# Patient Record
Sex: Female | Born: 1995 | Hispanic: No | Marital: Single | Smoking: Never smoker
Health system: Southern US, Community
[De-identification: ages and names within clinical notes are randomized; demographics above are authoritative.]

## PROBLEM LIST (undated history)

## (undated) ENCOUNTER — Inpatient Hospital Stay (HOSPITAL_COMMUNITY): Payer: Self-pay

## (undated) DIAGNOSIS — Z789 Other specified health status: Secondary | ICD-10-CM

## (undated) HISTORY — PX: NO PAST SURGERIES: SHX2092

---
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
## 2016-07-24 NOTE — L&D Delivery Note (Signed)
Operative Delivery Note At 7:33 PM a viable female was delivered via Vaginal, Vacuum Investment banker, operational(Extractor).  Presentation: OA; Position: Occiput,, Anterior; Station: +2.  FHR was in the 60s and not responding to change in position or any other manipulation. I decided the fstest and safest route of delivery was a vacuum extraction.  Placed the mighty vac as stated above With 2 contractions delivered the baby without difficulty  Verbal consent: obtained from patient and family  Risks and benefits discussed in detail.  Risks include, but are not limited to the risks of anesthesia, bleeding, infection, damage to maternal tissues, fetal cephalhematoma.  There is also the risk of inability to effect vaginal delivery of the head, or shoulder dystocia that cannot be resolved by established maneuvers, leading to the need for emergency cesarean section.  APGAR: pending ; weight  pending Placenta status: , intact normal Cord: 3 vessel  with the following complications: . None  Cord pH: 7.18  Anesthesia:  none Instruments: mighty vac Episiotomy: None Lacerations: 2nd degree;Sulcus;Perineal;Vaginal Suture Repair: 3.0 monocryl Est. Blood Loss (mL): 250  Mom to postpartum.  Baby to Couplet care / Skin to Skin.  Lazaro ArmsLuther H Oriel Rumbold 07/18/2017, 7:57 PM

## 2017-03-29 ENCOUNTER — Encounter: Payer: Self-pay | Admitting: Obstetrics and Gynecology

## 2017-03-29 ENCOUNTER — Ambulatory Visit (INDEPENDENT_AMBULATORY_CARE_PROVIDER_SITE_OTHER): Payer: Medicaid Other | Admitting: Obstetrics and Gynecology

## 2017-03-29 ENCOUNTER — Other Ambulatory Visit (HOSPITAL_COMMUNITY)
Admission: RE | Admit: 2017-03-29 | Discharge: 2017-03-29 | Disposition: A | Discharge status: Home / Self Care | Payer: Medicaid Other | Source: Ambulatory Visit | Attending: Obstetrics and Gynecology | Admitting: Obstetrics and Gynecology

## 2017-03-29 VITALS — BP 128/78 | HR 80 | Ht 62.0 in | Wt 137.1 lb

## 2017-03-29 DIAGNOSIS — Z23 Encounter for immunization: Secondary | ICD-10-CM

## 2017-03-29 DIAGNOSIS — O0932 Supervision of pregnancy with insufficient antenatal care, second trimester: Secondary | ICD-10-CM | POA: Diagnosis not present

## 2017-03-29 DIAGNOSIS — O0930 Supervision of pregnancy with insufficient antenatal care, unspecified trimester: Secondary | ICD-10-CM | POA: Insufficient documentation

## 2017-03-29 DIAGNOSIS — Z3480 Encounter for supervision of other normal pregnancy, unspecified trimester: Secondary | ICD-10-CM | POA: Insufficient documentation

## 2017-03-29 NOTE — Progress Notes (Signed)
Subjective:  Tracy Wang is a 21 y.o. G1P0 at 128w5d being seen today for her first OB appt. EDD by probable certain LMP. No chronic medical problems or medications.  She is currently monitored for the following issues for this low-risk pregnancy and has Supervision of other normal pregnancy, antepartum and Late prenatal care affecting pregnancy, antepartum, second trimester on her problem list.  Patient reports no complaints.  Contractions: Not present. Vag. Bleeding: None.  Movement: Present. Denies leaking of fluid.   The following portions of the patient's history were reviewed and updated as appropriate: allergies, current medications, past family history, past medical history, past social history, past surgical history and problem list. Problem list updated.  Objective:   Vitals:   03/29/17 1338 03/29/17 1339  BP: 128/78   Pulse: (!) 10   Weight: 137 lb 1.6 oz (62.2 kg)   Height:  5\' 2"  (1.575 m)    Fetal Status: Fetal Heart Rate (bpm): 156   Movement: Present     General:  Alert, oriented and cooperative. Patient is in no acute distress.  Skin: Skin is warm and dry. No rash noted.   Cardiovascular: Normal heart rate noted  Respiratory: Normal respiratory effort, no problems with respiration noted  Abdomen: Soft, gravid, appropriate for gestational age. Pain/Pressure: Absent     Pelvic:  Cervical exam deferred        Extremities: Normal range of motion.  Edema: None  Mental Status: Normal mood and affect. Normal behavior. Normal judgment and thought content.   Urinalysis:      Assessment and Plan:  Pregnancy: G1P0 at 1328w5d  1. Late prenatal care affecting pregnancy, antepartum, second trimester Prenatal care and labs reviewed today Flu vaccine - Obstetric Panel, Including HIV - Hemoglobinopathy evaluation - Cystic Fibrosis Mutation 97 - Culture, OB Urine - Varicella zoster antibody, IgG - Flu Vaccine QUAD 36+ mos IM (Fluarix, Quad PF) - AFP TETRA - US MFM OB COMP + 14  WK; Future - Urine cytology ancillary only  2. Supervision of other normal pregnancy, antepartum As above  Preterm labor symptoms and general obstetric precautions including but not limited to vaginal bleeding, contractions, leaking of fluid and fetal movement were reviewed in detail with the patient. Please refer to After Visit Summary for other counseling recommendations.  Return in about 4 weeks (around 04/26/2017) for OB visit.   Hermina StaggersErvin, Lorie Melichar L, MD

## 2017-03-29 NOTE — Patient Instructions (Signed)

## 2017-03-30 LAB — URINE CYTOLOGY ANCILLARY ONLY
Chlamydia: NEGATIVE
Neisseria Gonorrhea: NEGATIVE
TRICH (WINDOWPATH): NEGATIVE

## 2017-03-31 LAB — URINE CULTURE, OB REFLEX: ORGANISM ID, BACTERIA: NO GROWTH

## 2017-03-31 LAB — CULTURE, OB URINE

## 2017-04-02 LAB — OBSTETRIC PANEL, INCLUDING HIV
Antibody Screen: NEGATIVE
BASOS: 0 %
Basophils Absolute: 0 10*3/uL (ref 0.0–0.2)
EOS (ABSOLUTE): 0.1 10*3/uL (ref 0.0–0.4)
Eos: 1 %
HEP B S AG: NEGATIVE
HIV SCREEN 4TH GENERATION: NONREACTIVE
Hematocrit: 36.6 % (ref 34.0–46.6)
Hemoglobin: 11.5 g/dL (ref 11.1–15.9)
Immature Grans (Abs): 0.1 10*3/uL (ref 0.0–0.1)
Immature Granulocytes: 1 %
LYMPHS ABS: 1.7 10*3/uL (ref 0.7–3.1)
Lymphs: 16 %
MCH: 25.7 pg — ABNORMAL LOW (ref 26.6–33.0)
MCHC: 31.4 g/dL — ABNORMAL LOW (ref 31.5–35.7)
MCV: 82 fL (ref 79–97)
MONOS ABS: 0.7 10*3/uL (ref 0.1–0.9)
Monocytes: 7 %
NEUTROS ABS: 8.5 10*3/uL — AB (ref 1.4–7.0)
Neutrophils: 75 %
PLATELETS: 370 10*3/uL (ref 150–379)
RBC: 4.48 x10E6/uL (ref 3.77–5.28)
RDW: 14.7 % (ref 12.3–15.4)
RPR Ser Ql: NONREACTIVE
RUBELLA: 2.99 {index} (ref >0.99)
Rh Factor: POSITIVE
WBC: 11.1 10*3/uL — AB (ref 3.4–10.8)

## 2017-04-02 LAB — HEMOGLOBINOPATHY EVALUATION
HEMOGLOBIN A2 QUANTITATION: 2.1 % (ref 1.8–3.2)
HEMOGLOBIN F QUANTITATION: 0 % (ref 0.0–2.0)
HGB C: 0 %
HGB S: 0 %
HGB VARIANT: 0 %
Hgb A: 97.9 % (ref 96.4–98.8)

## 2017-04-02 LAB — VARICELLA ZOSTER ANTIBODY, IGG: Varicella zoster IgG: <135 index — ABNORMAL LOW (ref >165)

## 2017-04-03 LAB — AFP TETRA
DIA Mom Value: 0.53
DIA VALUE (EIA): 116.71 pg/mL
DSR (BY AGE) 1 IN: 1161
DSR (Second Trimester) 1 IN: 10000
Gestational Age: 20.5 WEEKS
MATERNAL AGE AT EDD: 20.2 a
MSAFP Mom: 1.51
MSAFP: 95.9 ng/mL
MSHCG Mom: 0.93
MSHCG: 22997 m[IU]/mL
OSB RISK: 2661
T18 (By Age): 1:4522 {titer}
TEST RESULTS AFP: NEGATIVE
Weight: 137 [lb_av]
uE3 Mom: 1.25
uE3 Value: 2.48 ng/mL

## 2017-04-04 LAB — CYSTIC FIBROSIS MUTATION 97: GENE DIS ANAL CARRIER INTERP BLD/T-IMP: NOT DETECTED

## 2017-04-04 LAB — URINE CYTOLOGY ANCILLARY ONLY
Bacterial vaginitis: NEGATIVE
CANDIDA VAGINITIS: NEGATIVE

## 2017-04-09 ENCOUNTER — Encounter: Payer: Self-pay | Admitting: Obstetrics and Gynecology

## 2017-04-09 ENCOUNTER — Ambulatory Visit (HOSPITAL_COMMUNITY)
Admission: RE | Admit: 2017-04-09 | Discharge: 2017-04-09 | Disposition: A | Discharge status: Home / Self Care | Payer: Medicaid Other | Source: Ambulatory Visit | Attending: Obstetrics and Gynecology | Admitting: Obstetrics and Gynecology

## 2017-04-09 DIAGNOSIS — O0932 Supervision of pregnancy with insufficient antenatal care, second trimester: Secondary | ICD-10-CM | POA: Diagnosis not present

## 2017-04-09 DIAGNOSIS — Z3A22 22 weeks gestation of pregnancy: Secondary | ICD-10-CM | POA: Insufficient documentation

## 2017-04-09 DIAGNOSIS — Z363 Encounter for antenatal screening for malformations: Secondary | ICD-10-CM | POA: Insufficient documentation

## 2017-04-09 DIAGNOSIS — Z283 Underimmunization status: Secondary | ICD-10-CM

## 2017-04-09 DIAGNOSIS — O09899 Supervision of other high risk pregnancies, unspecified trimester: Secondary | ICD-10-CM | POA: Insufficient documentation

## 2017-04-17 ENCOUNTER — Encounter (HOSPITAL_COMMUNITY): Payer: Self-pay | Admitting: *Deleted

## 2017-04-17 ENCOUNTER — Inpatient Hospital Stay (HOSPITAL_COMMUNITY)
Admission: AD | Admit: 2017-04-17 | Discharge: 2017-04-17 | Disposition: A | Discharge status: Home / Self Care | Payer: Medicaid Other | Source: Ambulatory Visit | Attending: Family Medicine | Admitting: Family Medicine

## 2017-04-17 DIAGNOSIS — B373 Candidiasis of vulva and vagina: Secondary | ICD-10-CM | POA: Diagnosis not present

## 2017-04-17 DIAGNOSIS — O98812 Other maternal infectious and parasitic diseases complicating pregnancy, second trimester: Secondary | ICD-10-CM | POA: Insufficient documentation

## 2017-04-17 DIAGNOSIS — R109 Unspecified abdominal pain: Secondary | ICD-10-CM | POA: Diagnosis not present

## 2017-04-17 DIAGNOSIS — N949 Unspecified condition associated with female genital organs and menstrual cycle: Secondary | ICD-10-CM

## 2017-04-17 DIAGNOSIS — Z3A23 23 weeks gestation of pregnancy: Secondary | ICD-10-CM | POA: Diagnosis not present

## 2017-04-17 DIAGNOSIS — O9989 Other specified diseases and conditions complicating pregnancy, childbirth and the puerperium: Secondary | ICD-10-CM

## 2017-04-17 DIAGNOSIS — R102 Pelvic and perineal pain: Secondary | ICD-10-CM | POA: Insufficient documentation

## 2017-04-17 DIAGNOSIS — B3731 Acute candidiasis of vulva and vagina: Secondary | ICD-10-CM

## 2017-04-17 LAB — URINALYSIS, ROUTINE W REFLEX MICROSCOPIC
BILIRUBIN URINE: NEGATIVE
Glucose, UA: NEGATIVE mg/dL
Hgb urine dipstick: NEGATIVE
KETONES UR: NEGATIVE mg/dL
NITRITE: NEGATIVE
PH: 7 (ref 5.0–8.0)
Protein, ur: 30 mg/dL — AB
Specific Gravity, Urine: 1.018 (ref 1.005–1.030)

## 2017-04-17 LAB — WET PREP, GENITAL
Clue Cells Wet Prep HPF POC: NONE SEEN
Sperm: NONE SEEN
TRICH WET PREP: NONE SEEN

## 2017-04-17 MED ORDER — TERCONAZOLE 0.4 % VA CREA: 1.0000 | TOPICAL_CREAM | Freq: Every day | VAGINAL | 0 refills | Status: DC

## 2017-04-17 NOTE — Discharge Instructions (Signed)
Round Ligament Pain The round ligament is a cord of muscle and tissue that helps to support the uterus. It can become a source of pain during pregnancy if it becomes stretched or twisted as the baby grows. The pain usually begins in the second trimester of pregnancy, and it can come and go until the baby is delivered. It is not a serious problem, and it does not cause harm to the baby. Round ligament pain is usually a short, sharp, and pinching pain, but it can also be a dull, lingering, and aching pain. The pain is felt in the lower side of the abdomen or in the groin. It usually starts deep in the groin and moves up to the outside of the hip area. Pain can occur with:  A sudden change in position.  Rolling over in bed.  Coughing or sneezing.  Physical activity.  Follow these instructions at home: Watch your condition for any changes. Take these steps to help with your pain:  When the pain starts, relax. Then try: ? Sitting down. ? Flexing your knees up to your abdomen. ? Lying on your side with one pillow under your abdomen and another pillow between your legs. ? Sitting in a warm bath for 15-20 minutes or until the pain goes away.  Take over-the-counter and prescription medicines only as told by your health care provider.  Move slowly when you sit and stand.  Avoid long walks if they cause pain.  Stop or lessen your physical activities if they cause pain.  Contact a health care provider if:  Your pain does not go away with treatment.  You feel pain in your back that you did not have before.  Your medicine is not helping. Get help right away if:  You develop a fever or chills.  You develop uterine contractions.  You develop vaginal bleeding.  You develop nausea or vomiting.  You develop diarrhea.  You have pain when you urinate. This information is not intended to replace advice given to you by your health care provider. Make sure you discuss any questions you have  with your health care provider. Document Released: 04/18/2008 Document Revised: 12/16/2015 Document Reviewed: 09/16/2014 Elsevier Interactive Patient Education  2018 [employer] Vaginal Yeast infection, Adult Vaginal yeast infection is a condition that causes soreness, swelling, and redness (inflammation) of the vagina. It also causes vaginal discharge. This is a common condition. Some women get this infection frequently. What are the causes? This condition is caused by a change in the normal balance of the yeast (candida) and bacteria that live in the vagina. This change causes an overgrowth of yeast, which causes the inflammation. What increases the risk? This condition is more likely to develop in:  Women who take antibiotic medicines.  Women who have diabetes.  Women who take birth control pills.  Women who are pregnant.  Women who douche often.  Women who have a weak defense (immune) system.  Women who have been taking steroid medicines for a long time.  Women who frequently wear tight clothing.  What are the signs or symptoms? Symptoms of this condition include:  White, thick vaginal discharge.  Swelling, itching, redness, and irritation of the vagina. The lips of the vagina (vulva) may be affected as well.  Pain or a burning feeling while urinating.  Pain during sex.  How is this diagnosed? This condition is diagnosed with a medical history and physical exam. This will include a pelvic exam. Your health care provider will  examine a sample of your vaginal discharge under a microscope. Your health care provider may send this sample for testing to confirm the diagnosis. °How is this treated? °This condition is treated with medicine. Medicines may be over-the-counter or prescription. You may be told to use one or more of the following: °· Medicine that is taken orally. °· Medicine that is applied as a cream. °· Medicine that is inserted directly into the vagina  (suppository). ° °Follow these instructions at home: °· Take or apply over-the-counter and prescription medicines only as told by your health care provider. °· Do not have sex until your health care provider has approved. Tell your sex partner that you have a yeast infection. That person should go to his or her health care provider if he or she develops symptoms. °· Do not wear tight clothes, such as pantyhose or tight pants. °· Avoid using tampons until your health care provider approves. °· Eat more yogurt. This may help to keep your yeast infection from returning. °· Try taking a sitz bath to help with discomfort. This is a warm water bath that is taken while you are sitting down. The water should only come up to your hips and should cover your buttocks. Do this 3-4 times per day or as told by your health care provider. °· Do not douche. °· Wear breathable, cotton underwear. °· If you have diabetes, keep your blood sugar levels under control. °Contact a health care provider if: °· You have a fever. °· Your symptoms go away and then return. °· Your symptoms do not get better with treatment. °· Your symptoms get worse. °· You have new symptoms. °· You develop blisters in or around your vagina. °· You have blood coming from your vagina and it is not your menstrual period. °· You develop pain in your abdomen. °This information is not intended to replace advice given to you by your health care provider. Make sure you discuss any questions you have with your health care provider. °Document Released: 04/19/2005 Document Revised: 12/22/2015 Document Reviewed: 01/11/2015 °Elsevier Interactive Patient Education © 2018 Elsevier Inc. ° °

## 2017-04-17 NOTE — MAU Provider Note (Signed)
History     CSN: [id number]  Arrival date and time: 04/17/17 1631   First Provider Initiated Contact with Patient 04/17/17 1749      Chief Complaint  Patient presents with  . Abdominal Pain  . Dysuria   G1 .3 wks here with LAP. Pain started about 3 hrs ago. She describes as constant, cramping in the bilateral lower abdomen. Has not taken anything for the pain. No VB or vaginal discharge. Good FM. No urinary sx. Had BM this am. No diarrhea. Eating and drinking well but admits to poor water intake today.   OB History    Gravida Para Term Preterm AB Living   1             SAB TAB Ectopic Multiple Live Births                  History reviewed. No pertinent past medical history.  History reviewed. No pertinent surgical history.  History reviewed. No pertinent family history.  Social History  Substance Use Topics  . Smoking status: Never Smoker  . Smokeless tobacco: Never Used  . Alcohol use No    Allergies: No Known Allergies  Prescriptions Prior to Admission  Medication Sig Dispense Refill Last Dose  . Prenatal Vit-Fe Fumarate-FA (MULTIVITAMIN-PRENATAL) 27-0.8 MG TABS tablet Take 1 tablet by mouth daily at 12 noon.   Taking    Review of Systems  Constitutional: Negative for fever.  Gastrointestinal: Positive for abdominal pain. Negative for constipation, diarrhea, nausea and vomiting.  Genitourinary: Negative for dysuria, hematuria, urgency, vaginal bleeding and vaginal discharge.   Physical Exam   Blood pressure 109/65, pulse 78, temperature 97.7 F (36.5 C), temperature source Oral, resp. rate 16, weight 138 lb 1.3 oz (62.6 kg), last menstrual period 11/04/2016, SpO2 100 %.  Physical Exam  Nursing note and vitals reviewed. Constitutional: She is oriented to person, place, and time. She appears well-developed and well-nourished. No distress.  HENT:  Head: Normocephalic and atraumatic.  Neck: Normal range of motion.  Cardiovascular: Normal rate.    Respiratory: Effort normal.  GI: Soft. She exhibits no distension and no mass. There is tenderness in the right lower quadrant and left lower quadrant. There is no rebound and no guarding.  Genitourinary:  Genitourinary Comments: Cervix closed/thick Thick white curdy discharge at introitus  Musculoskeletal: Normal range of motion.  Neurological: She is alert and oriented to person, place, and time.  Skin: Skin is warm and dry.  Psychiatric: She has a normal mood and affect.  EFM: 150 bpm, mod variability, + accels, no decels Toco: none  Results for orders placed or performed during the hospital encounter of 04/17/17 (from the past 24 hour(s))  Urinalysis, Routine w reflex microscopic     Status: Abnormal   Collection Time: 04/17/17  4:56 PM  Result Value Ref Range   Color, Urine YELLOW YELLOW   APPearance CLOUDY (A) CLEAR   Specific Gravity, Urine 1.018 1.005 - 1.030   pH 7.0 5.0 - 8.0   Glucose, UA NEGATIVE NEGATIVE mg/dL   Hgb urine dipstick NEGATIVE NEGATIVE   Bilirubin Urine NEGATIVE NEGATIVE   Ketones, ur NEGATIVE NEGATIVE mg/dL   Protein, ur 30 (A) NEGATIVE mg/dL   Nitrite NEGATIVE NEGATIVE   Leukocytes, UA LARGE (A) NEGATIVE   RBC / HPF 0-5 0 - 5 RBC/hpf   WBC, UA TOO NUMEROUS TO COUNT 0 - 5 WBC/hpf   Bacteria, UA RARE (A) NONE SEEN   Squamous Epithelial / LPF 6-30 (  A) NONE SEEN   Mucus PRESENT    Amorphous Crystal PRESENT   Wet prep, genital     Status: Abnormal   Collection Time: 04/17/17  5:57 PM  Result Value Ref Range   Yeast Wet Prep HPF POC PRESENT (A) NONE SEEN   Trich, Wet Prep NONE SEEN NONE SEEN   Clue Cells Wet Prep HPF POC NONE SEEN NONE SEEN   WBC, Wet Prep HPF POC MANY (A) NONE SEEN   Sperm NONE SEEN    MAU Course  Procedures  MDM Labs ordered and reviewed. No evidence of UTI or PTL. Will treat yeast. Pain likely RL, discussed comfort measures. Stable for discharge home.  Assessment and Plan   1. [redacted] weeks gestation of pregnancy   2. Round  ligament pain   3. Vaginal yeast infection    Discharge home Follow up in OB office as scheduled Rx Terazol PTL precautions  Allergies as of 04/17/2017   No Known Allergies     Medication List    TAKE these medications   multivitamin-prenatal 27-0.8 MG Tabs tablet Take 1 tablet by mouth daily at 12 noon.   terconazole 0.4 % vaginal cream Commonly known as:  TERAZOL 7 Place 1 applicator vaginally at bedtime.            Discharge Care Instructions        Start     Ordered   04/17/17 0000  Discharge patient    Question Answer Comment  Discharge disposition 01-Home or Self Care   Discharge patient date 04/17/2017      04/17/17 1841   04/17/17 0000  terconazole (TERAZOL 7) 0.4 % vaginal cream  Daily at bedtime    Question:  Supervising Provider  Answer:  Levie Heritage   04/17/17 1841     Donette Larry, CNM 04/17/2017, 6:44 PM

## 2017-04-17 NOTE — MAU Note (Signed)
Around 3pm started having intermittent abdominal cramping--rating pain 8/10 Has not tried any medication for pain  dysuria

## 2017-04-18 LAB — GC/CHLAMYDIA PROBE AMP (~~LOC~~) NOT AT ARMC
Chlamydia: NEGATIVE
NEISSERIA GONORRHEA: NEGATIVE

## 2017-04-26 ENCOUNTER — Ambulatory Visit (INDEPENDENT_AMBULATORY_CARE_PROVIDER_SITE_OTHER): Payer: Medicaid Other | Admitting: Obstetrics and Gynecology

## 2017-04-26 VITALS — BP 118/70 | HR 84 | Wt 139.0 lb

## 2017-04-26 DIAGNOSIS — Z283 Underimmunization status: Secondary | ICD-10-CM

## 2017-04-26 DIAGNOSIS — O0932 Supervision of pregnancy with insufficient antenatal care, second trimester: Secondary | ICD-10-CM

## 2017-04-26 DIAGNOSIS — O09892 Supervision of other high risk pregnancies, second trimester: Secondary | ICD-10-CM

## 2017-04-26 DIAGNOSIS — O09899 Supervision of other high risk pregnancies, unspecified trimester: Secondary | ICD-10-CM

## 2017-04-26 DIAGNOSIS — Z3480 Encounter for supervision of other normal pregnancy, unspecified trimester: Secondary | ICD-10-CM

## 2017-04-26 NOTE — Patient Instructions (Signed)

## 2017-04-26 NOTE — Progress Notes (Signed)
Pt c/o insomina

## 2017-04-26 NOTE — Progress Notes (Signed)
   PRENATAL VISIT NOTE  Subjective:  Tracy Wang is a 21 y.o. G1P0 at [redacted]w[redacted]d being seen today for ongoing prenatal care.  She is currently monitored for the following issues for this low-risk pregnancy and has Supervision of other normal pregnancy, antepartum; Late prenatal care affecting pregnancy, antepartum, second trimester; and Susceptible to varicella (non-immune), currently pregnant on her problem list.  Patient reports no complaints.  Contractions: Not present. Vag. Bleeding: None.  Movement: Present. Denies leaking of fluid.   The following portions of the patient's history were reviewed and updated as appropriate: allergies, current medications, past family history, past medical history, past social history, past surgical history and problem list. Problem list updated.  Objective:   Vitals:   04/26/17 1332  BP: 118/70  Pulse: 84  Weight: 139 lb (63 kg)    Fetal Status: Fetal Heart Rate (bpm): 155 Fundal Height: 25 cm Movement: Present     General:  Alert, oriented and cooperative. Patient is in no acute distress.  Skin: Skin is warm and dry. No rash noted.   Cardiovascular: Normal heart rate noted  Respiratory: Normal respiratory effort, no problems with respiration noted  Abdomen: Soft, gravid, appropriate for gestational age.  Pain/Pressure: Absent     Pelvic: Cervical exam deferred        Extremities: Normal range of motion.  Edema: None  Mental Status:  Normal mood and affect. Normal behavior. Normal judgment and thought content.   Assessment and Plan:  Pregnancy: G1P0 at [redacted]w[redacted]d  1. Late prenatal care affecting pregnancy, antepartum, second trimester Patient reports she had [place] at outside clinic on 12/15/16 and was [redacted]w[redacted]d at that time, making her [facility] 07/29/17. This conflicts with MFM [place] and EDC of 08/11/17. Patient will discuss with MFM at repeat US in two weeks.   2. Supervision of other normal pregnancy, antepartum - cont txt for yeast dx at MAU - [place] MFM OB FOLLOW UP;  Future  3. Susceptible to varicella (non-immune), currently pregnant Needs vaccine PP   Preterm labor symptoms and general obstetric precautions including but not limited to vaginal bleeding, contractions, leaking of fluid and fetal movement were reviewed in detail with the patient. Please refer to After Visit Summary for other counseling recommendations.  Return in about 3 weeks (around 05/17/2017) for OB visit.   Conan Bowens, MD

## 2017-05-10 ENCOUNTER — Ambulatory Visit (HOSPITAL_COMMUNITY)
Admission: RE | Admit: 2017-05-10 | Discharge: 2017-05-10 | Disposition: A | Discharge status: Home / Self Care | Payer: Medicaid Other | Source: Ambulatory Visit | Attending: Obstetrics and Gynecology | Admitting: Obstetrics and Gynecology

## 2017-05-10 ENCOUNTER — Encounter (HOSPITAL_COMMUNITY): Payer: Self-pay

## 2017-05-10 ENCOUNTER — Other Ambulatory Visit: Payer: Self-pay | Admitting: Obstetrics and Gynecology

## 2017-05-10 DIAGNOSIS — Z3A26 26 weeks gestation of pregnancy: Secondary | ICD-10-CM | POA: Insufficient documentation

## 2017-05-10 DIAGNOSIS — O0932 Supervision of pregnancy with insufficient antenatal care, second trimester: Secondary | ICD-10-CM

## 2017-05-10 DIAGNOSIS — O26892 Other specified pregnancy related conditions, second trimester: Secondary | ICD-10-CM | POA: Insufficient documentation

## 2017-05-10 DIAGNOSIS — Z362 Encounter for other antenatal screening follow-up: Secondary | ICD-10-CM

## 2017-05-10 HISTORY — DX: Other specified health status: Z78.9

## 2017-05-17 ENCOUNTER — Ambulatory Visit (INDEPENDENT_AMBULATORY_CARE_PROVIDER_SITE_OTHER): Payer: Medicaid Other | Admitting: Obstetrics and Gynecology

## 2017-05-17 VITALS — BP 114/67 | HR 90 | Wt 145.0 lb

## 2017-05-17 DIAGNOSIS — Z23 Encounter for immunization: Secondary | ICD-10-CM | POA: Diagnosis not present

## 2017-05-17 DIAGNOSIS — Z283 Underimmunization status: Secondary | ICD-10-CM

## 2017-05-17 DIAGNOSIS — Z3480 Encounter for supervision of other normal pregnancy, unspecified trimester: Secondary | ICD-10-CM

## 2017-05-17 DIAGNOSIS — O09899 Supervision of other high risk pregnancies, unspecified trimester: Secondary | ICD-10-CM

## 2017-05-17 DIAGNOSIS — Z3482 Encounter for supervision of other normal pregnancy, second trimester: Secondary | ICD-10-CM

## 2017-05-17 NOTE — Progress Notes (Signed)
   PRENATAL VISIT NOTE  Subjective:  Tracy Wang is a 21 y.o. G1P0 at 4033w5d being seen today for ongoing prenatal care.  She is currently monitored for the following issues for this low-risk pregnancy and has Supervision of other normal pregnancy, antepartum; Late prenatal care affecting pregnancy, antepartum, second trimester; and Susceptible to varicella (non-immune), currently pregnant on her problem list.  Patient reports no complaints.  Contractions: Not present. Vag. Bleeding: None.  Movement: Present. Denies leaking of fluid.   The following portions of the patient's history were reviewed and updated as appropriate: allergies, current medications, past family history, past medical history, past social history, past surgical history and problem list. Problem list updated.  Objective:   Vitals:   05/17/17 0934  BP: 114/67  Pulse: 90  Weight: 145 lb (65.8 kg)    Fetal Status: Fetal Heart Rate (bpm): 146 Fundal Height: 28 cm Movement: Present     General:  Alert, oriented and cooperative. Patient is in no acute distress.  Skin: Skin is warm and dry. No rash noted.   Cardiovascular: Normal heart rate noted  Respiratory: Normal respiratory effort, no problems with respiration noted  Abdomen: Soft, gravid, appropriate for gestational age.  Pain/Pressure: Absent     Pelvic: Cervical exam deferred        Extremities: Normal range of motion.  Edema: None  Mental Status:  Normal mood and affect. Normal behavior. Normal judgment and thought content.   Assessment and Plan:  Pregnancy: G1P0 at 1933w5d  1. Need for tetanus, diphtheria, and acellular pertussis (Tdap) vaccine in patient of adolescent age or older - Tdap vaccine greater than or equal to 7yo IM  2. Supervision of other normal pregnancy, antepartum Patient is doing well without complaint Third trimester labs today Patient with difficulty sleeping due to back discomfort. Comfort measures reviewed with the patient - Glucose  Tolerance, 2 Hours w/1 Hour - CBC - HIV antibody - RPR  3. Susceptible to varicella (non-immune), currently pregnant Will offer pp  Preterm labor symptoms and general obstetric precautions including but not limited to vaginal bleeding, contractions, leaking of fluid and fetal movement were reviewed in detail with the patient. Please refer to After Visit Summary for other counseling recommendations.  No Follow-up on file.   Catalina AntiguaPeggy Jamall Strohmeier, MD

## 2017-05-17 NOTE — Patient Instructions (Signed)
Tdap Vaccine (Tetanus, Diphtheria and Pertussis): What You Need to Know 1. Why get vaccinated? Tetanus, diphtheria and pertussis are very serious diseases. Tdap vaccine can protect us from these diseases. And, Tdap vaccine given to pregnant women can protect newborn babies against pertussis. TETANUS (Lockjaw) is rare in the [place] today. It causes painful muscle tightening and stiffness, usually all over the body.  It can lead to tightening of muscles in the head and neck so you can't open your mouth, swallow, or sometimes even breathe. Tetanus kills about 1 out of 10 people who are infected even after receiving the best medical care.  DIPHTHERIA is also rare in the [place] today. It can cause a thick coating to form in the back of the throat.  It can lead to breathing problems, heart failure, paralysis, and death.  PERTUSSIS (Whooping Cough) causes severe coughing spells, which can cause difficulty breathing, vomiting and disturbed sleep.  It can also lead to weight loss, incontinence, and rib fractures. Up to 2 in 100 adolescents and 5 in 100 adults with pertussis are hospitalized or have complications, which could include pneumonia or death.  These diseases are caused by bacteria. Diphtheria and pertussis are spread from person to person through secretions from coughing or sneezing. Tetanus enters the body through cuts, scratches, or wounds. Before vaccines, as many as 200,000 cases of diphtheria, 200,000 cases of pertussis, and hundreds of cases of tetanus, were reported in the [place] each year. Since vaccination began, reports of cases for tetanus and diphtheria have dropped by about 99% and for pertussis by about 80%. 2. Tdap vaccine Tdap vaccine can protect adolescents and adults from tetanus, diphtheria, and pertussis. One dose of Tdap is routinely given at age 11 or 12. People who did not get Tdap at that age should get it as soon as possible. Tdap is especially  important for healthcare professionals and anyone having close contact with a baby younger than 12 months. Pregnant women should get a dose of Tdap during every pregnancy, to protect the newborn from pertussis. Infants are most at risk for severe, life-threatening complications from pertussis. Another vaccine, called Td, protects against tetanus and diphtheria, but not pertussis. A Td booster should be given every 10 years. Tdap may be given as one of these boosters if you have never gotten Tdap before. Tdap may also be given after a severe cut or burn to prevent tetanus infection. Your doctor or the person giving you the vaccine can give you more information. Tdap may safely be given at the same time as other vaccines. 3. Some people should not get this vaccine  A person who has ever had a life-threatening allergic reaction after a previous dose of any diphtheria, tetanus or pertussis containing vaccine, OR has a severe allergy to any part of this vaccine, should not get Tdap vaccine. Tell the person giving the vaccine about any severe allergies.  Anyone who had coma or long repeated seizures within 7 days after a childhood dose of DTP or DTaP, or a previous dose of Tdap, should not get Tdap, unless a cause other than the vaccine was found. They can still get Td.  Talk to your doctor if you: ? have seizures or another nervous system problem, ? had severe pain or swelling after any vaccine containing diphtheria, tetanus or pertussis, ? ever had a condition called Guillain-Barr Syndrome (GBS), ? aren't feeling well on the day the shot is scheduled. 4. Risks With any medicine, including   vaccines, there is a chance of side effects. These are usually mild and go away on their own. Serious reactions are also possible but are rare. Most people who get Tdap vaccine do not have any problems with it. Mild problems following Tdap: (Did not interfere with activities)  Pain where the shot was given (about  3 in 4 adolescents or 2 in 3 adults)  Redness or swelling where the shot was given (about 1 person in 5)  Mild fever of at least 100.4F (up to about 1 in 25 adolescents or 1 in 100 adults)  Headache (about 3 or 4 people in 10)  Tiredness (about 1 person in 3 or 4)  Nausea, vomiting, diarrhea, stomach ache (up to 1 in 4 adolescents or 1 in 10 adults)  Chills, sore joints (about 1 person in 10)  Body aches (about 1 person in 3 or 4)  Rash, swollen glands (uncommon)  Moderate problems following Tdap: (Interfered with activities, but did not require medical attention)  Pain where the shot was given (up to 1 in 5 or 6)  Redness or swelling where the shot was given (up to about 1 in 16 adolescents or 1 in 12 adults)  Fever over 102F (about 1 in 100 adolescents or 1 in 250 adults)  Headache (about 1 in 7 adolescents or 1 in 10 adults)  Nausea, vomiting, diarrhea, stomach ache (up to 1 or 3 people in 100)  Swelling of the entire arm where the shot was given (up to about 1 in 500).  Severe problems following Tdap: (Unable to perform usual activities; required medical attention)  Swelling, severe pain, bleeding and redness in the arm where the shot was given (rare).  Problems that could happen after any vaccine:  People sometimes faint after a medical procedure, including vaccination. Sitting or lying down for about 15 minutes can help prevent fainting, and injuries caused by a fall. Tell your doctor if you feel dizzy, or have vision changes or ringing in the ears.  Some people get severe pain in the shoulder and have difficulty moving the arm where a shot was given. This happens very rarely.  Any medication can cause a severe allergic reaction. Such reactions from a vaccine are very rare, estimated at fewer than 1 in a million doses, and would happen within a few minutes to a few hours after the vaccination. As with any medicine, there is a very remote chance of a vaccine  causing a serious injury or death. The safety of vaccines is always being monitored. For more information, visit: www.cdc.gov/vaccinesafety/ 5. What if there is a serious problem? What should I look for? Look for anything that concerns you, such as signs of a severe allergic reaction, very high fever, or unusual behavior. Signs of a severe allergic reaction can include hives, swelling of the face and throat, difficulty breathing, a fast heartbeat, dizziness, and weakness. These would usually start a few minutes to a few hours after the vaccination. What should I do?  If you think it is a severe allergic reaction or other emergency that can't wait, call 9-1-1 or get the person to the nearest hospital. Otherwise, call your doctor.  Afterward, the reaction should be reported to the Vaccine Adverse Event Reporting System (VAERS). Your doctor might file this report, or you can do it yourself through the VAERS web site at www.vaers.hhs.gov, or by calling 1-800-822-7967. ? VAERS does not give medical advice. 6. The National Vaccine Injury Compensation Program The National   Vaccine Injury Compensation Program (VICP) is a federal program that was created to compensate people who may have been injured by certain vaccines. Persons who believe they may have been injured by a vaccine can learn about the program and about filing a claim by calling 1-800-338-2382 or visiting the VICP website at www.hrsa.gov/vaccinecompensation. There is a time limit to file a claim for compensation. 7. How can I learn more?  Ask your doctor. He or she can give you the vaccine package insert or suggest other sources of information.  Call your local or state health department.  Contact the Centers for Disease Control and Prevention (CDC): ? Call 1-800-232-4636 (1-800-CDC-INFO) or ? Visit CDC's website at www.cdc.gov/vaccines CDC Tdap Vaccine VIS (09/16/13) This information is not intended to replace advice given to you by your  health care provider. Make sure you discuss any questions you have with your health care provider. Document Released: 01/09/2012 Document Revised: 03/30/2016 Document Reviewed: 03/30/2016 Elsevier Interactive Patient Education  2017 Elsevier Inc.  

## 2017-05-18 LAB — CBC
HEMATOCRIT: 34.8 % (ref 34.0–46.6)
HEMOGLOBIN: 10.8 g/dL — AB (ref 11.1–15.9)
MCH: 24.9 pg — ABNORMAL LOW (ref 26.6–33.0)
MCHC: 31 g/dL — ABNORMAL LOW (ref 31.5–35.7)
MCV: 80 fL (ref 79–97)
Platelets: 374 10*3/uL (ref 150–379)
RBC: 4.33 x10E6/uL (ref 3.77–5.28)
RDW: 13.5 % (ref 12.3–15.4)
WBC: 12.4 10*3/uL — AB (ref 3.4–10.8)

## 2017-05-18 LAB — GLUCOSE TOLERANCE, 2 HOURS W/ 1HR
GLUCOSE, 2 HOUR: 97 mg/dL (ref 65–152)
Glucose, 1 hour: 144 mg/dL (ref 65–179)
Glucose, Fasting: 80 mg/dL (ref 65–91)

## 2017-05-18 LAB — RPR: RPR: NONREACTIVE

## 2017-05-18 LAB — HIV ANTIBODY (ROUTINE TESTING W REFLEX): HIV Screen 4th Generation wRfx: NONREACTIVE

## 2017-05-31 ENCOUNTER — Ambulatory Visit (INDEPENDENT_AMBULATORY_CARE_PROVIDER_SITE_OTHER): Payer: Medicaid Other | Admitting: Obstetrics and Gynecology

## 2017-05-31 ENCOUNTER — Encounter: Payer: Self-pay | Admitting: Obstetrics and Gynecology

## 2017-05-31 VITALS — BP 117/71 | HR 98 | Wt 148.0 lb

## 2017-05-31 DIAGNOSIS — Z3480 Encounter for supervision of other normal pregnancy, unspecified trimester: Secondary | ICD-10-CM

## 2017-05-31 DIAGNOSIS — Z3403 Encounter for supervision of normal first pregnancy, third trimester: Secondary | ICD-10-CM

## 2017-05-31 DIAGNOSIS — O09899 Supervision of other high risk pregnancies, unspecified trimester: Secondary | ICD-10-CM

## 2017-05-31 DIAGNOSIS — Z283 Underimmunization status: Secondary | ICD-10-CM

## 2017-05-31 NOTE — Progress Notes (Signed)
   PRENATAL VISIT NOTE  Subjective:  Tracy Wang is a 21 y.o. G1P0 at 5389w5d being seen today for ongoing prenatal care.  She is currently monitored for the following issues for this low-risk pregnancy and has Supervision of other normal pregnancy, antepartum; Late prenatal care affecting pregnancy, antepartum, second trimester; and Susceptible to varicella (non-immune), currently pregnant on their problem list.  Patient reports no complaints.  Contractions: Irregular. Vag. Bleeding: None.  Movement: Present. Denies leaking of fluid.   The following portions of the patient's history were reviewed and updated as appropriate: allergies, current medications, past family history, past medical history, past social history, past surgical history and problem list. Problem list updated.  Objective:   Vitals:   05/31/17 1345  BP: 117/71  Pulse: 98  Weight: 148 lb (67.1 kg)    Fetal Status: Fetal Heart Rate (bpm): 140 Fundal Height: 29 cm Movement: Present     General:  Alert, oriented and cooperative. Patient is in no acute distress.  Skin: Skin is warm and dry. No rash noted.   Cardiovascular: Normal heart rate noted  Respiratory: Normal respiratory effort, no problems with respiration noted  Abdomen: Soft, gravid, appropriate for gestational age.  Pain/Pressure: Absent     Pelvic: Cervical exam deferred        Extremities: Normal range of motion.  Edema: None  Mental Status:  Normal mood and affect. Normal behavior. Normal judgment and thought content.   Assessment and Plan:  Pregnancy: G1P0 at 589w5d  1. Supervision of other normal pregnancy, antepartum -patient is doing well without complaints - reviewed glucola results - patient is not interested in birthing classes  2. Susceptible to varicella (non-immune), currently pregnant Will offer pp  Preterm labor symptoms and general obstetric precautions including but not limited to vaginal bleeding, contractions, leaking of fluid and  fetal movement were reviewed in detail with the patient. Please refer to After Visit Summary for other counseling recommendations.  Return in about 2 weeks (around 06/14/2017) for ROB.   Catalina AntiguaPeggy Braden Cimo, MD

## 2017-06-13 ENCOUNTER — Ambulatory Visit (INDEPENDENT_AMBULATORY_CARE_PROVIDER_SITE_OTHER): Payer: Medicaid Other | Admitting: Obstetrics & Gynecology

## 2017-06-13 VITALS — BP 120/71 | HR 97 | Wt 153.0 lb

## 2017-06-13 DIAGNOSIS — Z3480 Encounter for supervision of other normal pregnancy, unspecified trimester: Secondary | ICD-10-CM

## 2017-06-13 NOTE — Progress Notes (Signed)
   PRENATAL VISIT NOTE  Subjective:  Tracy Wang is a 21 y.o. G1P0 at 7066w4d being seen today for ongoing prenatal care.  She is currently monitored for the following issues for this low-risk pregnancy and has Supervision of other normal pregnancy, antepartum; Late prenatal care affecting pregnancy, antepartum; and Susceptible to varicella (non-immune), currently pregnant on their problem list.  Patient reports backache.  Contractions: Irregular. Vag. Bleeding: None.  Movement: Present. Denies leaking of fluid.   The following portions of the patient's history were reviewed and updated as appropriate: allergies, current medications, past family history, past medical history, past social history, past surgical history and problem list. Problem list updated.  Objective:   Vitals:   06/13/17 0954  BP: 120/71  Pulse: 97  Weight: 153 lb (69.4 kg)    Fetal Status: Fetal Heart Rate (bpm): 140   Movement: Present     General:  Alert, oriented and cooperative. Patient is in no acute distress.  Skin: Skin is warm and dry. No rash noted.   Cardiovascular: Normal heart rate noted  Respiratory: Normal respiratory effort, no problems with respiration noted  Abdomen: Soft, gravid, appropriate for gestational age.  Pain/Pressure: Absent     Pelvic: Cervical exam deferred        Extremities: Normal range of motion.  Edema: None  Mental Status:  Normal mood and affect. Normal behavior. Normal judgment and thought content.   Assessment and Plan:  Pregnancy: G1P0 at 4666w4d  1. Supervision of other normal pregnancy, antepartum MS back pain instructions given  Preterm labor symptoms and general obstetric precautions including but not limited to vaginal bleeding, contractions, leaking of fluid and fetal movement were reviewed in detail with the patient. Please refer to After Visit Summary for other counseling recommendations.  Return in about 2 weeks (around 06/27/2017).   Scheryl DarterJames Arnold, MD

## 2017-06-13 NOTE — Patient Instructions (Signed)
Back Pain in Pregnancy Back pain during pregnancy is common. Back pain may be caused by several factors that are related to changes during your pregnancy. Follow these instructions at home: Managing pain, stiffness, and swelling  If directed, apply ice for sudden (acute) back pain. ? Put ice in a plastic bag. ? Place a towel between your skin and the bag. ? Leave the ice on for 20 minutes, 2-3 times per day.  If directed, apply heat to the affected area before you exercise: ? Place a towel between your skin and the heat pack or heating pad. ? Leave the heat on for 20-30 minutes. ? Remove the heat if your skin turns bright red. This is especially important if you are unable to feel pain, heat, or cold. You may have a greater risk of getting burned. Activity  Exercise as told by your health care provider. Exercising is the best way to prevent or manage back pain.  Listen to your body when lifting. If lifting hurts, ask for help or bend your knees. This uses your leg muscles instead of your back muscles.  Squat down when picking up something from the floor. Do not bend over.  Only use bed rest as told by your health care provider. Bed rest should only be used for the most severe episodes of back pain. Standing, Sitting, and Lying Down  Do not stand in one place for long periods of time.  Use good posture when sitting. Make sure your head rests over your shoulders and is not hanging forward. Use a pillow on your lower back if necessary.  Try sleeping on your side, preferably the left side, with a pillow or two between your legs. If you are sore after a night's rest, your bed may be too soft. A firm mattress may provide more support for your back during pregnancy. General instructions  Do not wear high heels.  Eat a healthy diet. Try to gain weight within your health care provider's recommendations.  Use a maternity girdle, elastic sling, or back brace as told by your health care  provider.  Take over-the-counter and prescription medicines only as told by your health care provider.  Keep all follow-up visits as told by your health care provider. This is important. This includes any visits with any specialists, such as a physical therapist. Contact a health care provider if:  Your back pain interferes with your daily activities.  You have increasing pain in other parts of your body. Get help right away if:  You develop numbness, tingling, weakness, or problems with the use of your arms or legs.  You develop severe back pain that is not controlled with medicine.  You have a sudden change in bowel or bladder control.  You develop shortness of breath, dizziness, or you faint.  You develop nausea, vomiting, or sweating.  You have back pain that is a rhythmic, cramping pain similar to labor pains. Labor pain is usually 1-2 minutes apart, lasts for about 1 minute, and involves a bearing down feeling or pressure in your pelvis.  You have back pain and your water breaks or you have vaginal bleeding.  You have back pain or numbness that travels down your leg.  Your back pain developed after you fell.  You develop pain on one side of your back.  You see blood in your urine.  You develop skin blisters in the area of your back pain. This information is not intended to replace advice given to you   by your health care provider. Make sure you discuss any questions you have with your health care provider. Document Released: 10/18/2005 Document Revised: 12/16/2015 Document Reviewed: 03/24/2015 Elsevier Interactive Patient Education  2018 Elsevier Inc.  

## 2017-06-13 NOTE — Progress Notes (Signed)
Pt states she has lower back pain. Pt has been doing back massages for relief.

## 2017-06-27 ENCOUNTER — Encounter: Payer: Medicaid Other | Admitting: Obstetrics and Gynecology

## 2017-06-28 ENCOUNTER — Ambulatory Visit (INDEPENDENT_AMBULATORY_CARE_PROVIDER_SITE_OTHER): Payer: Medicaid Other | Admitting: Obstetrics and Gynecology

## 2017-06-28 ENCOUNTER — Encounter: Payer: Self-pay | Admitting: Obstetrics and Gynecology

## 2017-06-28 VITALS — BP 118/69 | HR 89 | Wt 155.0 lb

## 2017-06-28 DIAGNOSIS — Z283 Underimmunization status: Secondary | ICD-10-CM

## 2017-06-28 DIAGNOSIS — O09899 Supervision of other high risk pregnancies, unspecified trimester: Secondary | ICD-10-CM

## 2017-06-28 DIAGNOSIS — Z3483 Encounter for supervision of other normal pregnancy, third trimester: Secondary | ICD-10-CM

## 2017-06-28 DIAGNOSIS — Z2839 Other underimmunization status: Secondary | ICD-10-CM

## 2017-06-28 DIAGNOSIS — O0933 Supervision of pregnancy with insufficient antenatal care, third trimester: Secondary | ICD-10-CM

## 2017-06-28 DIAGNOSIS — O09893 Supervision of other high risk pregnancies, third trimester: Secondary | ICD-10-CM

## 2017-06-28 DIAGNOSIS — Z3480 Encounter for supervision of other normal pregnancy, unspecified trimester: Secondary | ICD-10-CM

## 2017-06-28 DIAGNOSIS — O0930 Supervision of pregnancy with insufficient antenatal care, unspecified trimester: Secondary | ICD-10-CM

## 2017-06-28 NOTE — Progress Notes (Signed)
   PRENATAL VISIT NOTE  Subjective:  Tracy Wang is a 21 y.o. G1P0 at 5375w5d being seen today for ongoing prenatal care.  She is currently monitored for the following issues for this low-risk pregnancy and has Supervision of other normal pregnancy, antepartum; Late prenatal care affecting pregnancy, antepartum; and Susceptible to varicella (non-immune), currently pregnant on their problem list.  Patient reports occasional Deberah PeltonBraxton Hicks, none painful.  Contractions: Not present. Vag. Bleeding: None.  Movement: Present. Denies leaking of fluid.   The following portions of the patient's history were reviewed and updated as appropriate: allergies, current medications, past family history, past medical history, past social history, past surgical history and problem list. Problem list updated.  Objective:   Vitals:   06/28/17 1010  BP: 118/69  Pulse: 89  Weight: 155 lb (70.3 kg)    Fetal Status: Fetal Heart Rate (bpm): 153 Fundal Height: 33 cm Movement: Present     General:  Alert, oriented and cooperative. Patient is in no acute distress.  Skin: Skin is warm and dry. No rash noted.   Cardiovascular: Normal heart rate noted  Respiratory: Normal respiratory effort, no problems with respiration noted  Abdomen: Soft, gravid, appropriate for gestational age.  Pain/Pressure: Absent     Pelvic: Cervical exam deferred        Extremities: Normal range of motion.  Edema: None  Mental Status:  Normal mood and affect. Normal behavior. Normal judgment and thought content.   Assessment and Plan:  Pregnancy: G1P0 at 4975w5d  1. Supervision of other normal pregnancy, antepartum  2. Late prenatal care affecting pregnancy, antepartum  3. Susceptible to varicella (non-immune), currently pregnant VZV pp  Preterm labor symptoms and general obstetric precautions including but not limited to vaginal bleeding, contractions, leaking of fluid and fetal movement were reviewed in detail with the  patient. Please refer to After Visit Summary for other counseling recommendations.  Return in about 2 weeks (around 07/12/2017) for OB visit.   Conan BowensKelly M Sashia Campas, MD

## 2017-06-28 NOTE — Patient Instructions (Signed)
Places to have your son circumcised:    [facility] [phone] $480 while you are in hospital  [facility] [phone] $244 by 4 wks  [facility] [phone] $[phone] by 2 wks  [facility] [phone] $250 by 7 days [facility] [phone] $[phone] by 4 wks  These prices sometimes change but are roughly what you can expect to pay. Please call and confirm pricing.   Circumcision is considered an elective/non-medically necessary procedure. There are many reasons parents decide to have their sons circumsized. During the first year of life circumcised males have a reduced risk of urinary tract infections but after this year the rates between circumcised males and uncircumcised males are the same.  It is safe to have your son circumcised outside of the hospital and the places above perform them regularly.   

## 2017-06-28 NOTE — Progress Notes (Signed)
Patient reports good fetal movement, denies pain. 

## 2017-07-12 ENCOUNTER — Encounter: Payer: Self-pay | Admitting: Obstetrics and Gynecology

## 2017-07-12 ENCOUNTER — Ambulatory Visit (INDEPENDENT_AMBULATORY_CARE_PROVIDER_SITE_OTHER): Payer: Medicaid Other | Admitting: Obstetrics and Gynecology

## 2017-07-12 ENCOUNTER — Other Ambulatory Visit (HOSPITAL_COMMUNITY)
Admission: RE | Admit: 2017-07-12 | Discharge: 2017-07-12 | Disposition: A | Discharge status: Home / Self Care | Payer: Medicaid Other | Source: Ambulatory Visit | Attending: Obstetrics and Gynecology | Admitting: Obstetrics and Gynecology

## 2017-07-12 VITALS — BP 119/76 | HR 102 | Wt 162.0 lb

## 2017-07-12 DIAGNOSIS — Z3480 Encounter for supervision of other normal pregnancy, unspecified trimester: Secondary | ICD-10-CM

## 2017-07-12 DIAGNOSIS — Z283 Underimmunization status: Secondary | ICD-10-CM

## 2017-07-12 DIAGNOSIS — O09893 Supervision of other high risk pregnancies, third trimester: Secondary | ICD-10-CM

## 2017-07-12 DIAGNOSIS — O09899 Supervision of other high risk pregnancies, unspecified trimester: Secondary | ICD-10-CM

## 2017-07-12 NOTE — Progress Notes (Signed)
   PRENATAL VISIT NOTE  Subjective:  Tracy Wang is a 21 y.o. G1P0 at 10971w5d being seen today for ongoing prenatal care.  She is currently monitored for the following issues for this low-risk pregnancy and has Supervision of other normal pregnancy, antepartum; Late prenatal care affecting pregnancy, antepartum; and Susceptible to varicella (non-immune), currently pregnant on their problem list.  Patient reports braxton-hicks.  Contractions: Not present. Vag. Bleeding: None.  Movement: Present. Denies leaking of fluid.   The following portions of the patient's history were reviewed and updated as appropriate: allergies, current medications, past family history, past medical history, past social history, past surgical history and problem list. Problem list updated.  Objective:   Vitals:   07/12/17 1100  BP: 119/76  Pulse: (!) 102  Weight: 162 lb (73.5 kg)    Fetal Status: Fetal Heart Rate (bpm): 140   Movement: Present     General:  Alert, oriented and cooperative. Patient is in no acute distress.  Skin: Skin is warm and dry. No rash noted.   Cardiovascular: Normal heart rate noted  Respiratory: Normal respiratory effort, no problems with respiration noted  Abdomen: Soft, gravid, appropriate for gestational age.  Pain/Pressure: Absent     Pelvic: Cervical exam deferred        Extremities: Normal range of motion.  Edema: None  Mental Status:  Normal mood and affect. Normal behavior. Normal judgment and thought content.   Assessment and Plan:  Pregnancy: G1P0 at 7571w5d  1. Supervision of other normal pregnancy, antepartum - Strep Gp B NAA - Cervicovaginal ancillary only  2. Susceptible to varicella (non-immune), currently pregnant Will offer pp   Preterm labor symptoms and general obstetric precautions including but not limited to vaginal bleeding, contractions, leaking of fluid and fetal movement were reviewed in detail with the patient. Please refer to After Visit Summary for  other counseling recommendations.  Return in about 1 week (around 07/19/2017) for OB visit.   Conan BowensKelly M Aracelie Addis, MD

## 2017-07-13 LAB — CERVICOVAGINAL ANCILLARY ONLY
Bacterial vaginitis: NEGATIVE
CHLAMYDIA, DNA PROBE: NEGATIVE
Candida vaginitis: POSITIVE — AB
NEISSERIA GONORRHEA: NEGATIVE
Trichomonas: NEGATIVE

## 2017-07-14 LAB — STREP GP B NAA: Strep Gp B NAA: NEGATIVE

## 2017-07-18 ENCOUNTER — Other Ambulatory Visit: Payer: Self-pay

## 2017-07-18 ENCOUNTER — Inpatient Hospital Stay (HOSPITAL_COMMUNITY)
Admission: AD | Admit: 2017-07-18 | Discharge: 2017-07-20 | DRG: 807 | Disposition: A | Discharge status: Home / Self Care | Payer: Medicaid Other | Source: Ambulatory Visit | Attending: Obstetrics & Gynecology | Admitting: Obstetrics & Gynecology

## 2017-07-18 ENCOUNTER — Encounter (HOSPITAL_COMMUNITY): Payer: Self-pay | Admitting: *Deleted

## 2017-07-18 DIAGNOSIS — Z3A36 36 weeks gestation of pregnancy: Secondary | ICD-10-CM

## 2017-07-18 LAB — TYPE AND SCREEN
ABO/RH(D): O POS
ANTIBODY SCREEN: NEGATIVE

## 2017-07-18 LAB — CBC
HEMATOCRIT: 35.6 % — AB (ref 36.0–46.0)
Hemoglobin: 11.4 g/dL — ABNORMAL LOW (ref 12.0–15.0)
MCH: 23.1 pg — ABNORMAL LOW (ref 26.0–34.0)
MCHC: 32 g/dL (ref 30.0–36.0)
MCV: 72.2 fL — ABNORMAL LOW (ref 78.0–100.0)
Platelets: 356 10*3/uL (ref 150–400)
RBC: 4.93 MIL/uL (ref 3.87–5.11)
RDW: 14.7 % (ref 11.5–15.5)
WBC: 23.6 10*3/uL — AB (ref 4.0–10.5)

## 2017-07-18 MED ORDER — DIPHENHYDRAMINE HCL 25 MG PO CAPS: 25.0000 mg | ORAL_CAPSULE | Freq: Four times a day (QID) | ORAL | Status: DC | PRN

## 2017-07-18 MED ORDER — ONDANSETRON HCL 4 MG/2ML IJ SOLN: 4.0000 mg | Freq: Four times a day (QID) | INTRAMUSCULAR | Status: DC | PRN

## 2017-07-18 MED ORDER — ACETAMINOPHEN 325 MG PO TABS: 650.0000 mg | ORAL_TABLET | ORAL | Status: DC | PRN

## 2017-07-18 MED ORDER — SENNOSIDES-DOCUSATE SODIUM 8.6-50 MG PO TABS
2.0000 | ORAL_TABLET | ORAL | Status: DC
  Administered 2017-07-19 – 2017-07-20 (×2): 2 via ORAL
  Filled 2017-07-18 (×2): qty 2

## 2017-07-18 MED ORDER — LACTATED RINGERS IV SOLN: 500.0000 mL | INTRAVENOUS | Status: DC | PRN

## 2017-07-18 MED ORDER — IBUPROFEN 600 MG PO TABS
600.0000 mg | ORAL_TABLET | Freq: Four times a day (QID) | ORAL | Status: DC
  Administered 2017-07-18 – 2017-07-20 (×7): 600 mg via ORAL
  Filled 2017-07-18 (×7): qty 1

## 2017-07-18 MED ORDER — METHYLERGONOVINE MALEATE 0.2 MG/ML IJ SOLN: 0.2000 mg | INTRAMUSCULAR | Status: DC | PRN

## 2017-07-18 MED ORDER — ZOLPIDEM TARTRATE 5 MG PO TABS: 5.0000 mg | ORAL_TABLET | Freq: Every evening | ORAL | Status: DC | PRN

## 2017-07-18 MED ORDER — TETANUS-DIPHTH-ACELL PERTUSSIS 5-2.5-18.5 LF-MCG/0.5 IM SUSP: 0.5000 mL | Freq: Once | INTRAMUSCULAR | Status: DC

## 2017-07-18 MED ORDER — BENZOCAINE-MENTHOL 20-0.5 % EX AERO
1.0000 "application " | INHALATION_SPRAY | CUTANEOUS | Status: DC | PRN
  Administered 2017-07-19: 1 via TOPICAL
  Filled 2017-07-18 (×2): qty 56

## 2017-07-18 MED ORDER — OXYCODONE-ACETAMINOPHEN 5-325 MG PO TABS: 2.0000 | ORAL_TABLET | ORAL | Status: DC | PRN

## 2017-07-18 MED ORDER — OXYCODONE-ACETAMINOPHEN 5-325 MG PO TABS: 1.0000 | ORAL_TABLET | ORAL | Status: DC | PRN

## 2017-07-18 MED ORDER — DIBUCAINE 1 % RE OINT
1.0000 | TOPICAL_OINTMENT | RECTAL | Status: DC | PRN
Start: 2017-07-18 — End: 2017-07-20

## 2017-07-18 MED ORDER — OXYTOCIN 40 UNITS IN LACTATED RINGERS INFUSION - SIMPLE MED: 2.5000 [IU]/h | INTRAVENOUS | Status: DC

## 2017-07-18 MED ORDER — COCONUT OIL OIL
1.0000 "application " | TOPICAL_OIL | Status: DC | PRN
  Filled 2017-07-18: qty 120

## 2017-07-18 MED ORDER — ONDANSETRON HCL 4 MG/2ML IJ SOLN: 4.0000 mg | INTRAMUSCULAR | Status: DC | PRN

## 2017-07-18 MED ORDER — METHYLERGONOVINE MALEATE 0.2 MG PO TABS: 0.2000 mg | ORAL_TABLET | ORAL | Status: DC | PRN

## 2017-07-18 MED ORDER — WITCH HAZEL-GLYCERIN EX PADS
1.0000 "application " | MEDICATED_PAD | CUTANEOUS | Status: DC | PRN
  Administered 2017-07-19: 1 via TOPICAL

## 2017-07-18 MED ORDER — SOD CITRATE-CITRIC ACID 500-334 MG/5ML PO SOLN: 30.0000 mL | ORAL | Status: DC | PRN

## 2017-07-18 MED ORDER — OXYTOCIN 10 UNIT/ML IJ SOLN
INTRAMUSCULAR | Status: AC
  Filled 2017-07-18: qty 1

## 2017-07-18 MED ORDER — LACTATED RINGERS IV SOLN
INTRAVENOUS | Status: DC
  Administered 2017-07-18: 19:00:00 via INTRAVENOUS

## 2017-07-18 MED ORDER — LIDOCAINE HCL (PF) 1 % IJ SOLN
30.0000 mL | INTRAMUSCULAR | Status: DC | PRN
  Filled 2017-07-18: qty 30

## 2017-07-18 MED ORDER — FLEET ENEMA 7-19 GM/118ML RE ENEM: 1.0000 | ENEMA | RECTAL | Status: DC | PRN

## 2017-07-18 MED ORDER — PRENATAL MULTIVITAMIN CH
1.0000 | ORAL_TABLET | Freq: Every day | ORAL | Status: DC
  Administered 2017-07-19 – 2017-07-20 (×2): 1 via ORAL
  Filled 2017-07-18 (×2): qty 1

## 2017-07-18 MED ORDER — OXYTOCIN BOLUS FROM INFUSION
500.0000 mL | Freq: Once | INTRAVENOUS | Status: AC
  Administered 2017-07-18: 500 mL via INTRAVENOUS

## 2017-07-18 MED ORDER — OXYTOCIN 40 UNITS IN LACTATED RINGERS INFUSION - SIMPLE MED
INTRAVENOUS | Status: AC
  Filled 2017-07-18: qty 1000

## 2017-07-18 MED ORDER — OXYCODONE-ACETAMINOPHEN 5-325 MG PO TABS
1.0000 | ORAL_TABLET | ORAL | Status: DC | PRN
Start: 2017-07-18 — End: 2017-07-18

## 2017-07-18 MED ORDER — SIMETHICONE 80 MG PO CHEW: 80.0000 mg | CHEWABLE_TABLET | ORAL | Status: DC | PRN

## 2017-07-18 MED ORDER — ONDANSETRON HCL 4 MG PO TABS: 4.0000 mg | ORAL_TABLET | ORAL | Status: DC | PRN

## 2017-07-18 MED ORDER — LIDOCAINE HCL (PF) 1 % IJ SOLN
INTRAMUSCULAR | Status: AC
  Administered 2017-07-18: 30 mL
  Filled 2017-07-18: qty 30

## 2017-07-18 NOTE — Consult Note (Signed)
Neonatology Note:   Attendance at delivery:    I was asked by Dr. Despina HiddenEure to attend this vaginal delivery at 36 4/7 weeks with prolonged decel. The mother is a 21yo G1, GBS negative with good prenatal care. ROM 1 hours before delivery, fluid clear. Vacuum extraction.  Infant somewhat vigorous but without good spontaneous cry. Cord immediately clamped and infant brought to warmer.  HR >100.  He was warmed, dried and stimulated with good response in tone, activity and cry. Needed only minimal bulb suctioning. Ap 7/9. Lungs clear to ausc in DR. Father and mother updated. Cord pH 7.18.  To CN to care of Pediatrician.  Dineen Kidavid C. Leary RocaEhrmann, MD

## 2017-07-18 NOTE — H&P (Signed)
Obstetric History and Physical  Tracy Wang is a 21 y.o. G1P0101 with IUP at 5716w4d presenting for SOL. Patient states she has been having  regular contractions, minimal vaginal bleeding, intact membranes, with active fetal movement.    Prenatal Course Source of Care: [facility] Dating: By LMP --->  Estimated Date of Delivery: 08/11/17 Pregnancy complications or risks: Patient Active Problem List   Diagnosis Date Noted  . Normal labor 07/18/2017  . Vaginal delivery   . Susceptible to varicella (non-immune), currently pregnant 04/09/2017  . Supervision of other normal pregnancy, antepartum 03/29/2017  . Late prenatal care affecting pregnancy, antepartum 03/29/2017   She plans to breastfeed She desires oral progesterone-only contraceptive for postpartum contraception.    Prenatal labs and studies: ABO, Rh: O/Positive/-- (09/06 1616) Antibody: Negative (09/06 1616) Rubella: 2.99 (09/06 1616) RPR: Non Reactive (10/25 1205)  HBsAg: Negative (09/06 1616)  HIV:   Non-Reactive WUJ:WJXBJYNWGBS:Negative (12/20 1200) 2 hr Glucola  normal Genetic screening normal Anatomy US normal  Prenatal Transfer Tool  Maternal Diabetes: No Genetic Screening: Normal Maternal Ultrasounds/Referrals: Normal Fetal Ultrasounds or other Referrals:  None Maternal Substance Abuse:  No Significant Maternal Medications:  None Significant Maternal Lab Results: None  Past Medical History:  Diagnosis Date  . Medical history non-contributory     Past Surgical History:  Procedure Laterality Date  . NO PAST SURGERIES      OB History  Gravida Para Term Preterm AB Living  1 1   1   1   SAB TAB Ectopic Multiple Live Births        0 1    # Outcome Date GA Lbr Len/2nd Weight Sex Delivery Anes PTL Lv  1 Preterm 07/18/17 8016w4d 02:25 / 00:08 2.835 kg (6 lb 4 oz) M Vag-Vacuum None  LIV     Birth Comments: WNL      Social History   Socioeconomic History  . Marital status: Single    Spouse name: None  . Number of  children: None  . Years of education: None  . Highest education level: None  Social Needs  . Financial resource strain: None  . Food insecurity - worry: None  . Food insecurity - inability: None  . Transportation needs - medical: None  . Transportation needs - non-medical: None  Occupational History  . None  Tobacco Use  . Smoking status: Never Smoker  . Smokeless tobacco: Never Used  Substance and Sexual Activity  . Alcohol use: No  . Drug use: No  . Sexual activity: Not Currently    Partners: Male    Birth control/protection: None  Other Topics Concern  . None  Social History Narrative  . None    History reviewed. No pertinent family history.  Medications Prior to Admission  Medication Sig Dispense Refill Last Dose  . Prenatal Vit-Fe Fumarate-FA (MULTIVITAMIN-PRENATAL) 27-0.8 MG TABS tablet Take 1 tablet by mouth daily at 12 noon.   Taking    No Known Allergies  Review of Systems: Negative except for what is mentioned in HPI.  Physical Exam: BP 121/79   Pulse (!) 102   Temp 98 F (36.7 C) (Oral)   Resp 16   Ht 5\' 3"  (1.6 m)   Wt 74.8 kg (165 lb)   LMP 11/04/2016   Breastfeeding? Unknown   BMI 29.23 kg/m  CONSTITUTIONAL: Well-developed, well-nourished female in no acute distress.  HENT:  Normocephalic, atraumatic, External right and left ear normal. Oropharynx is clear and moist EYES: Conjunctivae and EOM are normal.  Pupils are equal, round, and reactive to light. No scleral icterus.  NECK: Normal range of motion, supple, no masses SKIN: Skin is warm and dry. No rash noted. Not diaphoretic. No erythema. No pallor. NEUROLOGIC: Alert and oriented to person, place, and time. Normal reflexes, muscle tone coordination. No cranial nerve deficit noted. PSYCHIATRIC: Normal mood and affect. Normal behavior. Normal judgment and thought content. CARDIOVASCULAR: Normal heart rate noted, regular rhythm RESPIRATORY: Effort and breath sounds normal, no problems with  respiration noted ABDOMEN: Soft, nontender, nondistended, gravid. MUSCULOSKELETAL: Normal range of motion. No edema and no tenderness. 2+ distal pulses.  Cervical Exam: Dilatation  10cm   Effacement 90%   Station +1 Presentation: cephalic FHT:  Baseline rate 145bpm   Variability moderate  Accelerations present   Decelerations present Contractions: Every 3-4 mins   Pertinent Labs/Studies:   Results for orders placed or performed during the hospital encounter of 07/18/17 (from the past 24 hour(s))  CBC     Status: Abnormal   Collection Time: 07/18/17  8:31 PM  Result Value Ref Range   WBC 23.6 (H) 4.0 - 10.5 K/uL   RBC 4.93 3.87 - 5.11 MIL/uL   Hemoglobin 11.4 (L) 12.0 - 15.0 g/dL   HCT 57.835.6 (L) 46.936.0 - 62.946.0 %   MCV 72.2 (L) 78.0 - 100.0 fL   MCH 23.1 (L) 26.0 - 34.0 pg   MCHC 32.0 30.0 - 36.0 g/dL   RDW 52.814.7 41.311.5 - 24.415.5 %   Platelets 356 150 - 400 K/uL    Assessment : Tracy NestleSonja M Wang is a 21 y.o. G1P0101 at 6481w4d being admitted for labor  Plan: Labor: Expectant management FWB: Reassuring fetal heart tracing.   GBS negative Delivery plan: Hopeful for vaginal delivery   Caryl AdaJazma Phelps, DO OB Fellow Faculty Practice, [facility] 07/18/2017, 9:17 PM

## 2017-07-18 NOTE — MAU Note (Signed)
Pt feeling cntrx since 1700; feeling pain and pressure. Some bleeding; denies LOF;Unsure about fetal movement.

## 2017-07-19 ENCOUNTER — Encounter: Payer: Medicaid Other | Admitting: Obstetrics

## 2017-07-19 ENCOUNTER — Other Ambulatory Visit: Payer: Self-pay | Admitting: Obstetrics and Gynecology

## 2017-07-19 ENCOUNTER — Encounter (HOSPITAL_COMMUNITY): Payer: Self-pay | Admitting: *Deleted

## 2017-07-19 LAB — ABO/RH: ABO/RH(D): O POS

## 2017-07-19 LAB — CBC
HCT: 31.5 % — ABNORMAL LOW (ref 36.0–46.0)
Hemoglobin: 9.9 g/dL — ABNORMAL LOW (ref 12.0–15.0)
MCH: 22.7 pg — AB (ref 26.0–34.0)
MCHC: 31.4 g/dL (ref 30.0–36.0)
MCV: 72.2 fL — ABNORMAL LOW (ref 78.0–100.0)
PLATELETS: 324 10*3/uL (ref 150–400)
RBC: 4.36 MIL/uL (ref 3.87–5.11)
RDW: 14.8 % (ref 11.5–15.5)
WBC: 21 10*3/uL — AB (ref 4.0–10.5)

## 2017-07-19 LAB — HEPATITIS B E ANTIBODY: Hep B E Ab: NEGATIVE

## 2017-07-19 LAB — RPR: RPR Ser Ql: NONREACTIVE

## 2017-07-19 MED ORDER — CLOTRIMAZOLE 1 % VA CREA: 1.0000 | TOPICAL_CREAM | Freq: Every day | VAGINAL | 0 refills | Status: DC

## 2017-07-19 NOTE — Progress Notes (Signed)
Post Partum Day 1 Subjective: no complaints, up ad lib, voiding and tolerating PO  Objective: Blood pressure (!) 108/56, pulse 99, temperature 99.7 F (37.6 C), temperature source Oral, resp. rate 18, height 5\' 3"  (1.6 m), weight 74.8 kg (165 lb), last menstrual period 11/04/2016, SpO2 100 %, unknown if currently breastfeeding.  Physical Exam:  General: alert, cooperative and no distress Lochia: appropriate Uterine Fundus: firm DVT Evaluation: No evidence of DVT seen on physical exam.  Recent Labs    07/18/17 2031 07/19/17 0536  HGB 11.4* 9.9*  HCT 35.6* 31.5*    Assessment/Plan: Plan for discharge tomorrow, Breastfeeding and Lactation consult   LOS: 1 day   Caryl AdaJazma Lakita Sahlin, DO 07/19/2017, 7:43 AM

## 2017-07-19 NOTE — Lactation Note (Signed)
This note was copied from a baby's chart. Lactation Consultation Note  Patient Name: Tracy Wang HoppingSonja Engelbrecht ZOXWR'UToday's Date: 07/19/2017 Reason for consult: Initial assessment;Late-preterm 34-36.6wks  First time mom who is fully BF her infant at this point. She expressed concerns because her baby is a late pre-term and he's very sleepy, had to wake him up for a feeding. Baby was able to latch on nipple shield size 20 on both breasts. Reviewed educational material and resources, including the late pre-term feeding handout. Troubleshoot for DEBP. Encouraged mom to feed on demand and continue pumping every 3 hours.  Maternal Data    Feeding Feeding Type: Breast Fed Length of feed: 17 min  LATCH Score Latch: Repeated attempts needed to sustain latch, nipple held in mouth throughout feeding, stimulation needed to elicit sucking reflex.  Audible Swallowing: Spontaneous and intermittent  Type of Nipple: Everted at rest and after stimulation  Comfort (Breast/Nipple): Soft / non-tender  Hold (Positioning): Assistance needed to correctly position infant at breast and maintain latch.  LATCH Score: 8  Interventions Interventions: Breast feeding basics reviewed;Assisted with latch;Skin to skin;Breast massage;Adjust position;Support pillows  Lactation Tools Discussed/Used Tools: Nipple Dorris CarnesShields   Consult Status Consult Status: Follow-up Date: 07/20/17 Follow-up type: In-patient    Tajanae Guilbault Venetia ConstableS Lakeith Careaga 07/19/2017, 10:46 AM

## 2017-07-19 NOTE — Progress Notes (Signed)
Script sent for yeast infection.

## 2017-07-20 LAB — BIRTH TISSUE RECOVERY COLLECTION (PLACENTA DONATION)

## 2017-07-20 MED ORDER — IBUPROFEN 600 MG PO TABS: 600.0000 mg | ORAL_TABLET | Freq: Four times a day (QID) | ORAL | 0 refills | Status: AC

## 2017-07-20 MED ORDER — NORETHINDRONE 0.35 MG PO TABS: 1.0000 | ORAL_TABLET | Freq: Every day | ORAL | 11 refills | Status: DC

## 2017-07-20 NOTE — Discharge Instructions (Signed)
Postpartum Care After Vaginal Delivery °The period of time right after you deliver your newborn is called the postpartum period. °What kind of medical care will I receive? °· You may continue to receive fluids and medicines through an IV tube inserted into one of your veins. °· If an incision was made near your vagina (episiotomy) or if you had some vaginal tearing during delivery, cold compresses may be placed on your episiotomy or your tear. This helps to reduce pain and swelling. °· You may be given a squirt bottle to use when you go to the bathroom. You may use this until you are comfortable wiping as usual. To use the squirt bottle, follow these steps: °? Before you urinate, fill the squirt bottle with warm water. Do not use hot water. °? After you urinate, while you are sitting on the toilet, use the squirt bottle to rinse the area around your urethra and vaginal opening. This rinses away any urine and blood. °? You may do this instead of wiping. As you start healing, you may use the squirt bottle before wiping yourself. Make sure to wipe gently. °? Fill the squirt bottle with clean water every time you use the bathroom. °· You will be given sanitary pads to wear. °How can I expect to feel? °· You may not feel the need to urinate for several hours after delivery. °· You will have some soreness and pain in your abdomen and vagina. °· If you are breastfeeding, you may have uterine contractions every time you breastfeed for up to several weeks postpartum. Uterine contractions help your uterus return to its normal size. °· It is normal to have vaginal bleeding (lochia) after delivery. The amount and appearance of lochia is often similar to a menstrual period in the first week after delivery. It will gradually decrease over the next few weeks to a dry, yellow-brown discharge. For most women, lochia stops completely by 6-8 weeks after delivery. Vaginal bleeding can vary from woman to woman. °· Within the first few  days after delivery, you may have breast engorgement. This is when your breasts feel heavy, full, and uncomfortable. Your breasts may also throb and feel hard, tightly stretched, warm, and tender. After this occurs, you may have milk leaking from your breasts. Your health care provider can help you relieve discomfort due to breast engorgement. Breast engorgement should go away within a few days. °· You may feel more sad or worried than normal due to hormonal changes after delivery. These feelings should not last more than a few days. If these feelings do not go away after several days, speak with your health care provider. °How should I care for myself? °· Tell your health care provider if you have pain or discomfort. °· Drink enough water to keep your urine clear or pale yellow. °· Wash your hands thoroughly with soap and water for at least 20 seconds after changing your sanitary pads, after using the toilet, and before holding or feeding your baby. °· If you are not breastfeeding, avoid touching your breasts a lot. Doing this can make your breasts produce more milk. °· If you become weak or lightheaded, or you feel like you might faint, ask for help before: °? Getting out of bed. °? Showering. °· Change your sanitary pads frequently. Watch for any changes in your flow, such as a sudden increase in volume, a change in color, the passing of large blood clots. If you pass a blood clot from your vagina, save it   to show to your health care provider. Do not flush blood clots down the toilet without having your health care provider look at them. °· Make sure that all your vaccinations are up to date. This can help protect you and your baby from getting certain diseases. You may need to have immunizations done before you leave the hospital. °· If desired, talk with your health care provider about methods of family planning or birth control (contraception). °How can I start bonding with my baby? °Spending as much time as  possible with your baby is very important. During this time, you and your baby can get to know each other and develop a bond. Having your baby stay with you in your room (rooming in) can give you time to get to know your baby. Rooming in can also help you become comfortable caring for your baby. Breastfeeding can also help you bond with your baby. °How can I plan for returning home with my baby? °· Make sure that you have a car seat installed in your vehicle. °? Your car seat should be checked by a certified car seat installer to make sure that it is installed safely. °? Make sure that your baby fits into the car seat safely. °· Ask your health care provider any questions you have about caring for yourself or your baby. Make sure that you are able to contact your health care provider with any questions after leaving the hospital. °This information is not intended to replace advice given to you by your health care provider. Make sure you discuss any questions you have with your health care provider. °Document Released: 05/07/2007 Document Revised: 12/13/2015 Document Reviewed: 06/14/2015 °Elsevier Interactive Patient Education © 2018 Elsevier Inc. ° °

## 2017-07-20 NOTE — Discharge Summary (Signed)
OB Discharge Summary     Patient Name: Tracy Wang DOB: [birth year] MRN: [id number]  Date of admission: 07/18/2017 Delivering MD: Duane LopeEURE, LUTHER H   Date of discharge: 07/20/2017  Admitting diagnosis: 38WKS CTX AND PRESSURE Intrauterine pregnancy: 7661w4d     Secondary diagnosis:  Active Problems:   Normal labor   Vaginal delivery  Additional problems: VAD     Discharge diagnosis: Preterm Pregnancy Delivered                                                                                                Post partum procedures:none'  Augmentation: none  Complications: None  Hospital course:  Onset of Labor With Vaginal Delivery     21 y.o. yo G1P0101 at 6561w4d was admitted in Active Labor on 07/18/2017. Patient had an uncomplicated labor course as follows:  Membrane Rupture Time/Date: 7:21 PM ,07/18/2017   Intrapartum Procedures: Episiotomy: None [1]                                         Lacerations:  2nd degree [3];Sulcus [9];Perineal [11];Vaginal [6]  Patient had a delivery of a Viable infant. 07/18/2017  Information for the patient's newborn:  Tracy Wang, Tracy Wang [[id number]]  Delivery Method: Vaginal, Vacuum (Extractor)(Filed from Delivery Summary)    Pateint had an uncomplicated postpartum course.  She is ambulating, tolerating a regular diet, passing flatus, and urinating well. Patient is discharged home in stable condition on 07/20/17.   Physical exam  Vitals:   07/19/17 0252 07/19/17 1100 07/19/17 1937 07/20/17 0532  BP: (!) 108/56 (!) 120/58 120/70 118/64  Pulse: 99 99 96 89  Resp: 18 16 17 20   Temp: 99.7 F (37.6 C) 99.3 F (37.4 C) 98.5 F (36.9 C) 97.7 F (36.5 C)  TempSrc: Oral Oral Oral Axillary  SpO2:    100%  Weight:      Height:       General: alert, cooperative and no distress Lochia: appropriate Uterine Fundus: firm Incision: N/A DVT Evaluation: No evidence of DVT seen on physical exam. Negative Homan's sign. No cords or calf  tenderness. Labs: Lab Results  Component Value Date   WBC 21.0 (H) 07/19/2017   HGB 9.9 (L) 07/19/2017   HCT 31.5 (L) 07/19/2017   MCV 72.2 (L) 07/19/2017   PLT 324 07/19/2017   No flowsheet data found.  Discharge instruction: per After Visit Summary and "Baby and Me Booklet".  After visit meds:  Allergies as of 07/20/2017   No Known Allergies     Medication List    TAKE these medications   ibuprofen 600 MG tablet Commonly known as:  ADVIL,MOTRIN Take 1 tablet (600 mg total) by mouth every 6 (six) hours.   multivitamin-prenatal 27-0.8 MG Tabs tablet Take 1 tablet by mouth daily at 12 noon.   norethindrone 0.35 MG tablet Commonly known as:  ORTHO MICRONOR Take 1 tablet (0.35 mg total) by mouth daily.       Diet: routine diet  Activity: Advance  as tolerated. Pelvic rest for 6 weeks.   Outpatient follow up:4 weeks Follow up Appt:No future appointments. Follow up Visit:No Follow-up on file.  Postpartum contraception: Progesterone only pills  Newborn Data: Live born female  Birth Weight: 6 lb 4 oz (2835 g) APGAR: 7, 9  Newborn Delivery   Birth date/time:  07/18/2017 19:33:00 Delivery type:  Vaginal, Vacuum (Extractor)     Baby Feeding: Breast Disposition:home with mother   07/20/2017 Tracy Wang,Tracy Wang, CNM

## 2017-07-20 NOTE — Lactation Note (Signed)
This note was copied from a baby's chart. Lactation Consultation Note  Patient Name: Tracy Lia HoppingSonja Manthe ZOXWR'UToday's Date: 07/20/2017  Mom is giving bottles after breastfeeding because she was concerned about baby's intake.  She is also pumping and obtaining small amounts of colostrum.  Plan is to continue feeding with cues using nipple shield if helpful, post pump with manual pump and offer expressed milk/formula per volume parameters.   Maternal Data    Feeding    LATCH Score                   Interventions    Lactation Tools Discussed/Used     Consult Status      Huston FoleyMOULDEN, Jeryn Cerney S 07/20/2017, 9:42 AM

## 2017-07-22 ENCOUNTER — Ambulatory Visit: Payer: Self-pay

## 2017-07-22 NOTE — Lactation Note (Signed)
This note was copied from a baby's chart. Lactation Consultation Note  Patient Name: Boy Lia HoppingSonja Bertz ZOXWR'UToday's Date: 07/22/2017   Visited with P1 Mom of 8242w4d baby at 6686 hrs old.  Baby at 1% weight loss.  Mom has been exclusively double pumping and feeding baby her EBM by bottle.  Obtaining 35-45 ml when pumping at present.  Encouraged breast massage, and hand expression along with double pumping. .   Encouraged STS, and feed baby on cue or at least >8 times per 24 hrs.  Mom to try to latch baby to breast prn, and offer supplement.  Recommended an OP lactation appointment, message sent to request appt.  Mom does not have WIC.  To call in am about signing up and obtaining a DEBP.  Mom aware of Rental DEBP program out of gift shop.  Mom asked about purchasing a DEBP, recommended Medela or Spectra pumps.   Demonstrated how to make pump parts into a manual double pump until she can obtain a DEBP.    Engorgement prevention and treatment discussed.  Mom aware of OP lactation support available to her.  Encouraged Mom to call prn for any questions.    Judee ClaraSmith, Parveen Freehling E 07/22/2017, 9:40 AM

## 2017-08-16 ENCOUNTER — Ambulatory Visit: Payer: Medicaid Other | Admitting: Obstetrics & Gynecology

## 2017-08-17 ENCOUNTER — Ambulatory Visit: Payer: Medicaid Other | Admitting: Obstetrics and Gynecology

## 2017-08-22 ENCOUNTER — Ambulatory Visit: Payer: Medicaid Other | Admitting: Obstetrics & Gynecology

## 2017-08-29 ENCOUNTER — Encounter: Payer: Self-pay | Admitting: Obstetrics & Gynecology

## 2017-08-29 ENCOUNTER — Ambulatory Visit (INDEPENDENT_AMBULATORY_CARE_PROVIDER_SITE_OTHER): Payer: Medicaid Other | Admitting: Obstetrics & Gynecology

## 2017-08-29 DIAGNOSIS — Z30011 Encounter for initial prescription of contraceptive pills: Secondary | ICD-10-CM | POA: Insufficient documentation

## 2017-08-29 DIAGNOSIS — Z1389 Encounter for screening for other disorder: Secondary | ICD-10-CM

## 2017-08-29 MED ORDER — LEVONORGESTREL-ETHINYL ESTRAD 0.15-30 MG-MCG PO TABS: 1.0000 | ORAL_TABLET | Freq: Every day | ORAL | 11 refills | Status: AC

## 2017-08-29 NOTE — Patient Instructions (Signed)
Oral Contraception Use Oral contraceptive pills (OCPs) are medicines taken to prevent pregnancy. OCPs work by preventing the ovaries from releasing eggs. The hormones in OCPs also cause the cervical mucus to thicken, preventing the sperm from entering the uterus. The hormones also cause the uterine lining to become thin, not allowing a fertilized egg to attach to the inside of the uterus. OCPs are highly effective when taken exactly as prescribed. However, OCPs do not prevent sexually transmitted diseases (STDs). Safe sex practices, such as using condoms along with an OCP, can help prevent STDs. Before taking OCPs, you may have a physical exam and Pap test. Your health care provider may also order blood tests if necessary. Your health care provider will make sure you are a good candidate for oral contraception. Discuss with your health care provider the possible side effects of the OCP you may be prescribed. When starting an OCP, it can take 2 to 3 months for the body to adjust to the changes in hormone levels in your body. How to take oral contraceptive pills Your health care provider may advise you on how to start taking the first cycle of OCPs. Otherwise, you can:  Start on day 1 of your menstrual period. You will not need any backup contraceptive protection with this start time.  Start on the first Sunday after your menstrual period or the day you get your prescription. In these cases, you will need to use backup contraceptive protection for the first week.  Start the pill at any time of your cycle. If you take the pill within 5 days of the start of your period, you are protected against pregnancy right away. In this case, you will not need a backup form of birth control. If you start at any other time of your menstrual cycle, you will need to use another form of birth control for 7 days. If your OCP is the type called a minipill, it will protect you from pregnancy after taking it for 2 days (48  hours).  After you have started taking OCPs:  If you forget to take 1 pill, take it as soon as you remember. Take the next pill at the regular time.  If you miss 2 or more pills, call your health care provider because different pills have different instructions for missed doses. Use backup birth control until your next menstrual period starts.  If you use a 28-day pack that contains inactive pills and you miss 1 of the last 7 pills (pills with no hormones), it will not matter. Throw away the rest of the non-hormone pills and start a new pill pack.  No matter which day you start the OCP, you will always start a new pack on that same day of the week. Have an extra pack of OCPs and a backup contraceptive method available in case you miss some pills or lose your OCP pack. Follow these instructions at home:  Do not smoke.  Always use a condom to protect against STDs. OCPs do not protect against STDs.  Use a calendar to mark your menstrual period days.  Read the information and directions that came with your OCP. Talk to your health care provider if you have questions. Contact a health care provider if:  You develop nausea and vomiting.  You have abnormal vaginal discharge or bleeding.  You develop a rash.  You miss your menstrual period.  You are losing your hair.  You need treatment for mood swings or depression.  You   get dizzy when taking the OCP.  You develop acne from taking the OCP.  You become pregnant. Get help right away if:  You develop chest pain.  You develop shortness of breath.  You have an uncontrolled or severe headache.  You develop numbness or slurred speech.  You develop visual problems.  You develop pain, redness, and swelling in the legs. This information is not intended to replace advice given to you by your health care provider. Make sure you discuss any questions you have with your health care provider. Document Released: 06/29/2011 Document  Revised: 12/16/2015 Document Reviewed: 12/29/2012 Elsevier Interactive Patient Education  2017 Elsevier Inc.  

## 2017-08-29 NOTE — Progress Notes (Signed)
Post Partum Exam  Tracy Wang is a 22 y.o. 261P0101 female who presents for a postpartum visit. She is 6 weeks postpartum following a spontaneous vaginal delivery. I have fully reviewed the prenatal and intrapartum course. The delivery was at 36 gestational weeks.  Anesthesia: none. Postpartum course has been good. Baby's course has been good. Baby is feeding by bottle - gerber soothe. Bleeding staining only. Bowel function is normal. Bladder function is normal. Patient is not sexually active. Contraception method is none. Postpartum depression screening:neg  The following portions of the patient's history were reviewed and updated as appropriate: allergies, current medications, past family history, past medical history, past social history, past surgical history and problem list.   Review of Systems Pertinent items are noted in HPI.    Objective:  unknown if currently breastfeeding.  General:  alert, cooperative and no distress   Breasts:     Lungs: normal effort     Abdomen: soft, non-tender; bowel sounds normal; no masses,  no organomegaly   Vulva:  not evaluated  Vagina: not evaluated                    Assessment:    normal postpartum exam. Pap smear not done at today's visit.   Plan:   1. Contraception: OCP (estrogen/progesterone) 2. Chanteal prescribed 3. Follow up as needed.   Adam PhenixArnold, James G, MD 08/29/2017

## 2019-07-03 DIAGNOSIS — Z30011 Encounter for initial prescription of contraceptive pills: Secondary | ICD-10-CM | POA: Diagnosis not present
